# Patient Record
Sex: Male | Born: 1965 | Race: White | Hispanic: No | Marital: Married | Smoking: Never smoker
Health system: Southern US, Community
[De-identification: ages and names within clinical notes are randomized; demographics above are authoritative.]

---

## 1998-10-16 ENCOUNTER — Emergency Department (HOSPITAL_COMMUNITY): Admission: EM | Admit: 1998-10-16 | Discharge: 1998-10-17 | Payer: Self-pay

## 1998-10-19 ENCOUNTER — Emergency Department (HOSPITAL_COMMUNITY): Admission: EM | Admit: 1998-10-19 | Discharge: 1998-10-19 | Payer: Self-pay | Admitting: Emergency Medicine

## 1998-10-21 ENCOUNTER — Emergency Department (HOSPITAL_COMMUNITY): Admission: EM | Admit: 1998-10-21 | Discharge: 1998-10-21 | Payer: Self-pay

## 1999-12-23 ENCOUNTER — Encounter: Payer: Self-pay | Admitting: Emergency Medicine

## 1999-12-23 ENCOUNTER — Emergency Department (HOSPITAL_COMMUNITY): Admission: EM | Admit: 1999-12-23 | Discharge: 1999-12-23 | Payer: Self-pay | Admitting: *Deleted

## 2004-04-04 ENCOUNTER — Emergency Department (HOSPITAL_COMMUNITY): Admission: EM | Admit: 2004-04-04 | Discharge: 2004-04-04 | Payer: Self-pay | Admitting: Family Medicine

## 2006-03-10 ENCOUNTER — Emergency Department (HOSPITAL_COMMUNITY): Admission: EM | Admit: 2006-03-10 | Discharge: 2006-03-10 | Payer: Self-pay | Admitting: *Deleted

## 2006-10-13 ENCOUNTER — Emergency Department (HOSPITAL_COMMUNITY): Admission: EM | Admit: 2006-10-13 | Discharge: 2006-10-13 | Payer: Self-pay | Admitting: Emergency Medicine

## 2007-01-01 ENCOUNTER — Emergency Department (HOSPITAL_COMMUNITY): Admission: EM | Admit: 2007-01-01 | Discharge: 2007-01-01 | Payer: Self-pay | Admitting: Emergency Medicine

## 2017-05-08 DIAGNOSIS — E7800 Pure hypercholesterolemia, unspecified: Secondary | ICD-10-CM | POA: Diagnosis not present

## 2017-05-08 DIAGNOSIS — Z0000 Encounter for general adult medical examination without abnormal findings: Secondary | ICD-10-CM | POA: Diagnosis not present

## 2017-05-10 DIAGNOSIS — E7800 Pure hypercholesterolemia, unspecified: Secondary | ICD-10-CM | POA: Diagnosis not present

## 2017-06-20 DIAGNOSIS — M7542 Impingement syndrome of left shoulder: Secondary | ICD-10-CM | POA: Diagnosis not present

## 2017-06-20 DIAGNOSIS — M25512 Pain in left shoulder: Secondary | ICD-10-CM | POA: Diagnosis not present

## 2017-06-21 DIAGNOSIS — M25612 Stiffness of left shoulder, not elsewhere classified: Secondary | ICD-10-CM | POA: Diagnosis not present

## 2017-06-21 DIAGNOSIS — M19012 Primary osteoarthritis, left shoulder: Secondary | ICD-10-CM | POA: Diagnosis not present

## 2017-06-21 DIAGNOSIS — M25512 Pain in left shoulder: Secondary | ICD-10-CM | POA: Diagnosis not present

## 2018-10-10 DIAGNOSIS — Z23 Encounter for immunization: Secondary | ICD-10-CM | POA: Diagnosis not present

## 2019-09-03 DIAGNOSIS — Z20828 Contact with and (suspected) exposure to other viral communicable diseases: Secondary | ICD-10-CM | POA: Diagnosis not present

## 2019-09-09 ENCOUNTER — Other Ambulatory Visit: Payer: Self-pay

## 2019-09-09 DIAGNOSIS — Z20822 Contact with and (suspected) exposure to covid-19: Secondary | ICD-10-CM

## 2019-09-10 LAB — NOVEL CORONAVIRUS, NAA: SARS-CoV-2, NAA: NOT DETECTED

## 2019-10-13 ENCOUNTER — Other Ambulatory Visit: Payer: Self-pay | Admitting: *Deleted

## 2019-10-13 DIAGNOSIS — Z20822 Contact with and (suspected) exposure to covid-19: Secondary | ICD-10-CM

## 2019-10-13 DIAGNOSIS — Z20828 Contact with and (suspected) exposure to other viral communicable diseases: Secondary | ICD-10-CM | POA: Diagnosis not present

## 2019-10-15 LAB — NOVEL CORONAVIRUS, NAA: SARS-CoV-2, NAA: NOT DETECTED

## 2020-01-22 DIAGNOSIS — Z20822 Contact with and (suspected) exposure to covid-19: Secondary | ICD-10-CM | POA: Diagnosis not present

## 2020-02-29 DIAGNOSIS — M25561 Pain in right knee: Secondary | ICD-10-CM | POA: Diagnosis not present

## 2020-02-29 DIAGNOSIS — Z6828 Body mass index (BMI) 28.0-28.9, adult: Secondary | ICD-10-CM | POA: Diagnosis not present

## 2020-03-02 ENCOUNTER — Ambulatory Visit (INDEPENDENT_AMBULATORY_CARE_PROVIDER_SITE_OTHER): Payer: BC Managed Care – PPO | Admitting: Orthopaedic Surgery

## 2020-03-02 ENCOUNTER — Other Ambulatory Visit: Payer: Self-pay

## 2020-03-02 ENCOUNTER — Encounter: Payer: Self-pay | Admitting: Orthopaedic Surgery

## 2020-03-02 VITALS — BP 122/111 | HR 100 | Ht 72.0 in | Wt 210.5 lb

## 2020-03-02 DIAGNOSIS — M25561 Pain in right knee: Secondary | ICD-10-CM

## 2020-03-02 NOTE — Progress Notes (Signed)
Subjective:    Patient ID: Larry Ramirez, male    DOB: 1966/12/16, 54 y.o.   MRN: [id number]  HPI He was lifting his dog on 02-27-2020 and twisted his knee on the right.  He felt pain also in his back and his right knee.  His back is much better but his knee has gotten much worse.  He has swelling of the right knee and cannot bear weight on it.  He cannot fully extend the right knee.  He has had locking twice of the knee.  It gives way.  It really hurts.  He saw his family doctor at [facility] on 02-29-2020.  X-rays were done.  I have independently reviewed and interpreted x-rays of this patient done at another site by another physician or qualified health professional.  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  No fracture was noted  He is on ibuprofen and prednisone from his family doctor.  He cannot stand and cannot work.   He has no redness, no other trauma.   Review of Systems  Constitutional: Positive for activity change.  Musculoskeletal: Positive for arthralgias, gait problem and joint swelling.  All other systems reviewed and are negative.  For Review of Systems, all other systems reviewed and are negative.  The following is a summary of the past history medically, past history surgically, known current medicines, social history and family history.  This information is gathered electronically by the computer from prior information and documentation.  I review this each visit and have found including this information at this point in the chart is beneficial and informative.   History reviewed. No pertinent past medical history.  History reviewed. No pertinent surgical history.  Current Outpatient Medications on File Prior to Visit  Medication Sig Dispense Refill  . doxylamine, Sleep, (UNISOM) 25 MG tablet Take 25 mg by mouth at bedtime as needed.    Marland Kitchen ibuprofen (ADVIL) 800 MG tablet Take 800 mg by mouth every 8 (eight) hours as needed.     No current  facility-administered medications on file prior to visit.    Social History   Socioeconomic History  . Marital status: Married    Spouse name: Not on file  . Number of children: Not on file  . Years of education: Not on file  . Highest education level: Not on file  Occupational History  . Not on file  Tobacco Use  . Smoking status: Never Smoker  . Smokeless tobacco: Never Used  Substance and Sexual Activity  . Alcohol use: Not on file  . Drug use: Not on file  . Sexual activity: Not on file  Other Topics Concern  . Not on file  Social History Narrative  . Not on file   Social Determinants of Health   Financial Resource Strain:   . Difficulty of Paying Living Expenses: Not on file  Food Insecurity:   . Worried About Programme researcher, broadcasting/film/video in the Last Year: Not on file  . Ran Out of Food in the Last Year: Not on file  Transportation Needs:   . Lack of Transportation (Medical): Not on file  . Lack of Transportation (Non-Medical): Not on file  Physical Activity:   . Days of Exercise per Week: Not on file  . Minutes of Exercise per Session: Not on file  Stress:   . Feeling of Stress : Not on file  Social Connections:   . Frequency of Communication with Friends and Family:  Not on file  . Frequency of Social Gatherings with Friends and Family: Not on file  . Attends Religious Services: Not on file  . Active Member of Clubs or Organizations: Not on file  . Attends Archivist Meetings: Not on file  . Marital Status: Not on file  Intimate Partner Violence:   . Fear of Current or Ex-Partner: Not on file  . Emotionally Abused: Not on file  . Physically Abused: Not on file  . Sexually Abused: Not on file    History reviewed. No pertinent family history.  BP (!) 122/111   Pulse 100   Ht 6' (1.829 m)   Wt 210 lb 8 oz (95.5 kg)   BMI 28.55 kg/m   Body mass index is 28.55 kg/m.     Objective:   Physical Exam Vitals and nursing note reviewed.    Constitutional:      Appearance: He is well-developed.  HENT:     Head: Normocephalic and atraumatic.  Eyes:     Conjunctiva/sclera: Conjunctivae normal.     Pupils: Pupils are equal, round, and reactive to light.  Cardiovascular:     Rate and Rhythm: Normal rate and regular rhythm.  Pulmonary:     Effort: Pulmonary effort is normal.  Abdominal:     Palpations: Abdomen is soft.  Musculoskeletal:     Cervical back: Normal range of motion and neck supple.       Legs:  Skin:    General: Skin is warm and dry.  Neurological:     Mental Status: He is alert and oriented to person, place, and time.     Cranial Nerves: No cranial nerve deficit.     Motor: No abnormal muscle tone.     Coordination: Coordination normal.     Deep Tendon Reflexes: Reflexes are normal and symmetric. Reflexes normal.  Psychiatric:        Behavior: Behavior normal.        Thought Content: Thought content normal.        Judgment: Judgment normal.     I have reviewed the notes from [facility].     Assessment & Plan:   Encounter Diagnosis  Name Primary?  . Acute pain of right knee Yes   I feel he has a torn lateral meniscus.  I feel he may need surgery.  He needs MRI of the knee as he has locking, inability to fully extend, pain with any weight bearing, possible need for arthroscopy.  He is to continue his present medicine.  PROCEDURE NOTE:  The patient requests injections of the right knee , verbal consent was obtained.  The right knee was prepped appropriately after time out was performed.   Sterile technique was observed and injection of 1 cc of Depo-Medrol 40 mg with several cc's of plain xylocaine. Anesthesia was provided by ethyl chloride and a 20-gauge needle was used to inject the knee area. The injection was tolerated well.  A band aid dressing was applied.  The patient was advised to apply ice later today and tomorrow to the injection sight as needed.  I will try to schedule the MRI  asap.  Return after MRI.  Call if any problem.  Precautions discussed.  Out of work note given.  Disability form signed and completed for him.  Electronically Signed Sanjuana Kava, MD 3/3/202110:41 AM

## 2020-03-02 NOTE — Patient Instructions (Signed)
OUT OF WORK ?

## 2020-03-05 ENCOUNTER — Other Ambulatory Visit: Payer: Self-pay

## 2020-03-05 ENCOUNTER — Ambulatory Visit
Admission: RE | Admit: 2020-03-05 | Discharge: 2020-03-05 | Disposition: A | Discharge status: Home / Self Care | Payer: BC Managed Care – PPO | Source: Ambulatory Visit | Attending: Orthopaedic Surgery | Admitting: Orthopaedic Surgery

## 2020-03-05 DIAGNOSIS — M25561 Pain in right knee: Secondary | ICD-10-CM | POA: Diagnosis not present

## 2020-03-08 ENCOUNTER — Encounter: Payer: Self-pay | Admitting: Orthopaedic Surgery

## 2020-03-08 ENCOUNTER — Ambulatory Visit (INDEPENDENT_AMBULATORY_CARE_PROVIDER_SITE_OTHER): Payer: BC Managed Care – PPO | Admitting: Orthopaedic Surgery

## 2020-03-08 ENCOUNTER — Other Ambulatory Visit: Payer: Self-pay

## 2020-03-08 VITALS — BP 139/86 | HR 104 | Temp 98.0°F | Ht 72.0 in | Wt 208.0 lb

## 2020-03-08 DIAGNOSIS — M25561 Pain in right knee: Secondary | ICD-10-CM

## 2020-03-08 MED ORDER — NAPROXEN 500 MG PO TABS: 500.0000 mg | ORAL_TABLET | Freq: Two times a day (BID) | ORAL | 5 refills | Status: AC

## 2020-03-08 NOTE — Progress Notes (Signed)
Patient Larry Ramirez, male DOB:September 13, 1966, 54 y.o. IRC:[id number]  Chief Complaint  Patient presents with  . Knee Pain    R/here to review MRI    HPI  Larry Ramirez is a 54 y.o. male who has right knee pain.  He had MRI which showed: IMPRESSION: 1. Intact ligamentous structures and no acute bony findings. 2. No meniscal tears. 3. Mild/moderate tricompartmental degenerative chondrosis., most notable at the patellofemoral joint along the medial aspect of the apex. 4. No joint effusion or Baker's cyst.    I explained the findings to him.  I had thought he had a meniscus tear as he lacked full extension and it was locking.    He want to return to work.  I will give Naprosyn.  Stop the ibuprofen.  I will begin PT.  Body mass index is 28.21 kg/m.  ROS  Review of Systems  All other systems reviewed and are negative.  The following is a summary of the past history medically, past history surgically, known current medicines, social history and family history.  This information is gathered electronically by the computer from prior information and documentation.  I review this each visit and have found including this information at this point in the chart is beneficial and informative.    History reviewed. No pertinent past medical history.  History reviewed. No pertinent surgical history.  History reviewed. No pertinent family history.  Social History Social History   Tobacco Use  . Smoking status: Never Smoker  . Smokeless tobacco: Never Used  Substance Use Topics  . Alcohol use: Not on file  . Drug use: Not on file    No Known Allergies  Current Outpatient Medications  Medication Sig Dispense Refill  . doxylamine, Sleep, (UNISOM) 25 MG tablet Take 25 mg by mouth at bedtime as needed.    Marland Kitchen ibuprofen (ADVIL) 800 MG tablet Take 800 mg by mouth every 8 (eight) hours as needed.     No current facility-administered medications for this visit.     Physical  Exam  Blood pressure 139/86, pulse (!) 104, temperature 98 F (36.7 C), height 6' (1.829 m), weight 208 lb (94.3 kg).  Constitutional: overall normal hygiene, normal nutrition, well developed, normal grooming, normal body habitus. Assistive device:none  Musculoskeletal: gait and station Limp right, muscle tone and strength are normal, no tremors or atrophy is present.  .  Neurological: coordination overall normal.  Deep tendon reflex/nerve stretch intact.  Sensation normal.  Cranial nerves II-XII intact.   Skin:   Normal overall no scars, lesions, ulcers or rashes. No psoriasis.  Psychiatric: Alert and oriented x 3.  Recent memory intact, remote memory unclear.  Normal mood and affect. Well groomed.  Good eye contact.  Cardiovascular: overall no swelling, no varicosities, no edema bilaterally, normal temperatures of the legs and arms, no clubbing, cyanosis and good capillary refill.  Lymphatic: palpation is normal.  Right knee is tender, more laterally,  ROM 0 to 105, limp right, stable.  NV intact.  All other systems reviewed and are negative   The patient has been educated about the nature of the problem(s) and counseled on treatment options.  The patient appeared to understand what I have discussed and is in agreement with it.  Encounter Diagnosis  Name Primary?  . Acute pain of right knee Yes    PLAN Call if any problems.  Precautions discussed.  Continue current medications. Stop ibuprofen, begin Naprosyn.   Return to clinic 3 weeks  Begin PT.  Electronically Signed Sanjuana Kava, MD 3/9/202110:50 AM

## 2020-03-08 NOTE — Patient Instructions (Signed)
May return to work  

## 2020-03-14 DIAGNOSIS — M25561 Pain in right knee: Secondary | ICD-10-CM | POA: Diagnosis not present

## 2020-03-16 ENCOUNTER — Ambulatory Visit: Payer: BC Managed Care – PPO | Admitting: Orthopaedic Surgery

## 2020-03-29 ENCOUNTER — Ambulatory Visit (INDEPENDENT_AMBULATORY_CARE_PROVIDER_SITE_OTHER): Payer: BC Managed Care – PPO | Admitting: Orthopaedic Surgery

## 2020-03-29 ENCOUNTER — Other Ambulatory Visit: Payer: Self-pay

## 2020-03-29 ENCOUNTER — Encounter: Payer: Self-pay | Admitting: Orthopaedic Surgery

## 2020-03-29 VITALS — BP 114/78 | HR 83 | Ht 72.0 in | Wt 211.0 lb

## 2020-03-29 DIAGNOSIS — M25561 Pain in right knee: Secondary | ICD-10-CM | POA: Diagnosis not present

## 2020-03-29 NOTE — Progress Notes (Signed)
I am better  He went to PT once.  He is doing his exercises.  His knee is much improved, no pain, no swelling.  He works two jobs daily.  He has no new trauma. ROM is full.  Encounter Diagnosis  Name Primary?  . Acute pain of right knee Yes   I will see as needed.  Call if any problem.  Precautions discussed.   Electronically Signed Darreld Mclean, MD 3/30/20219:14 AM

## 2021-11-24 IMAGING — MR MR KNEE*R* W/O CM
6 series · 38 of 40 positions shown · non-contrast
Comparison: Radiograph 02/29/2020

CLINICAL DATA: Right knee pain, swelling and weakness for 1 week
after picking up dog.

EXAM:
MRI OF THE RIGHT KNEE WITHOUT CONTRAST
TECHNIQUE: Multiplanar, multisequence MR imaging of the knee was performed. No
intravenous contrast was administered.

[Series 6: T2 fat-sat · axial · right · 4.0mm · 0.50mm/px · z∈[-112,+41]mm · 8 of 36 slices shown (1 of 3)]
[im 1/36]
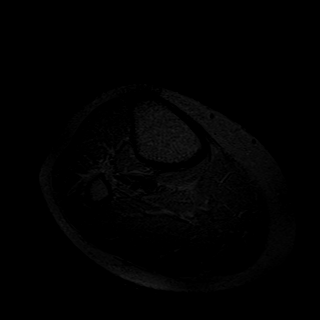
[im 6/36]
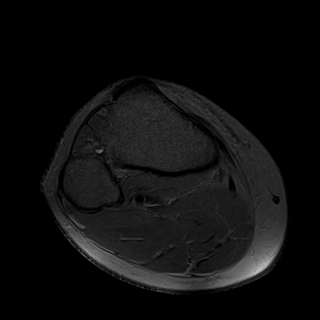
[im 11/36]
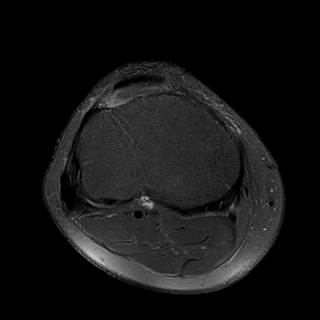
[im 16/36]
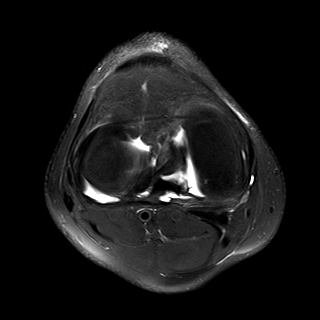
[im 21/36]
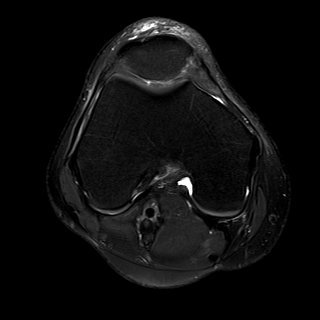
[im 26/36]
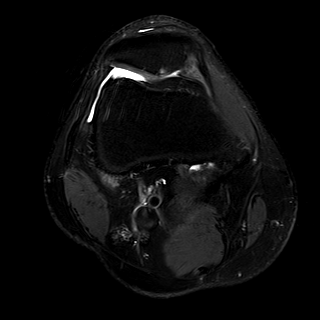
[im 31/36]
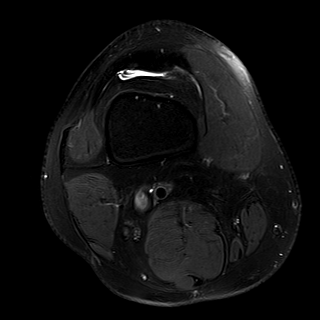
[im 36/36]
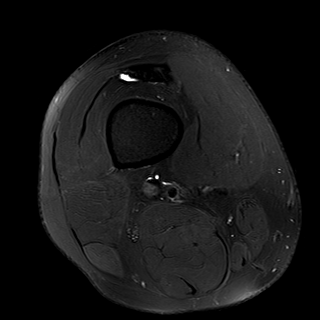

[Series 7: T2 fat-sat · coronal · right · 4.0mm · 0.42mm/px · 6 of 30 slices shown (2 of 3)]
[im 1/30]
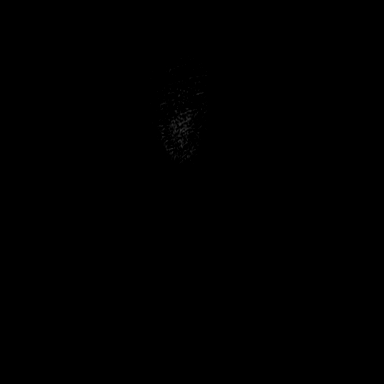
[im 6/30]
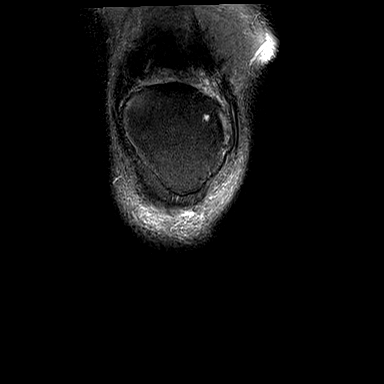
[im 12/30]
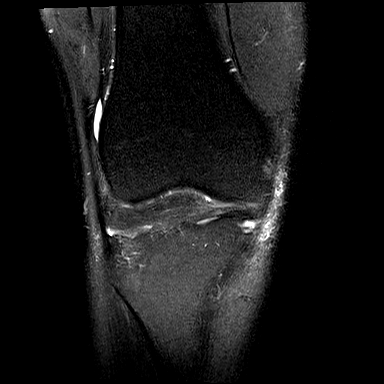
[im 18/30]
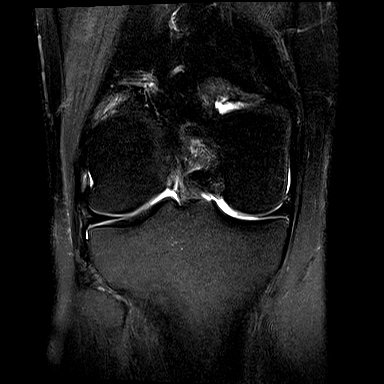
[im 24/30]
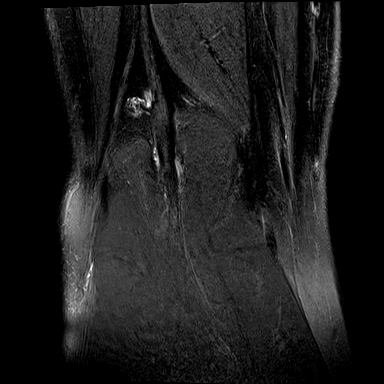
[im 30/30]
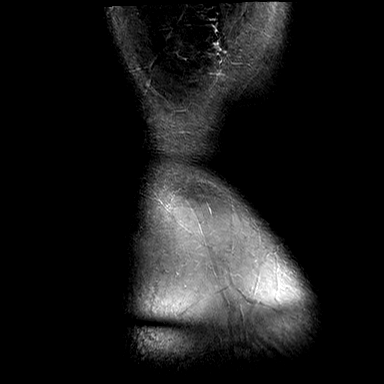

[Series 8: T1 · coronal · right · 4.0mm · 0.42mm/px · 4 of 30 slices shown]
[im 1/30]
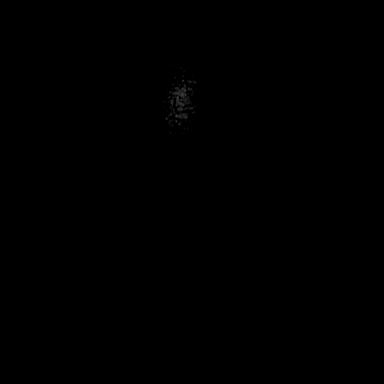
[im 6/30]
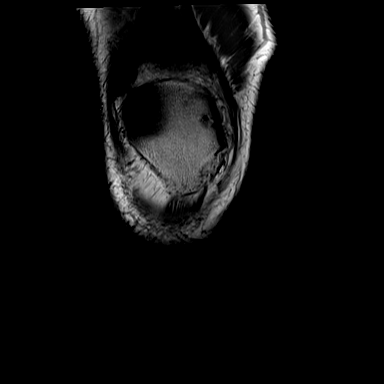
[im 12/30]
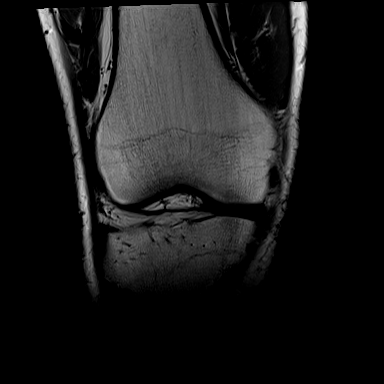
[im 18/30]
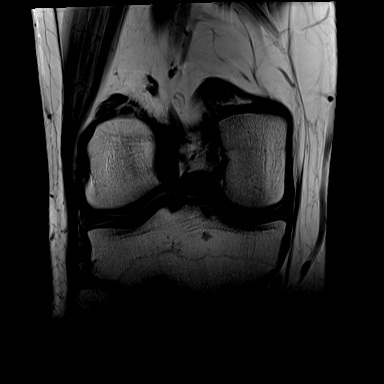

[Series 9: PD fat-sat · coronal · right · 3.0mm · 0.47mm/px · 8 of 36 slices shown (1 of 2)]
[im 1/36]
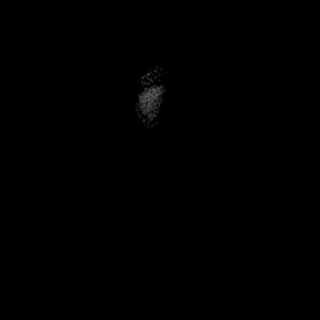
[im 6/36]
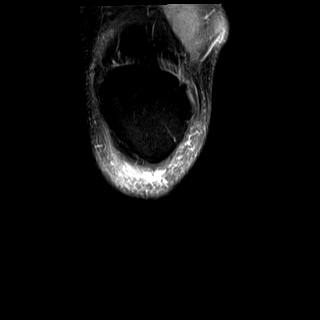
[im 11/36]
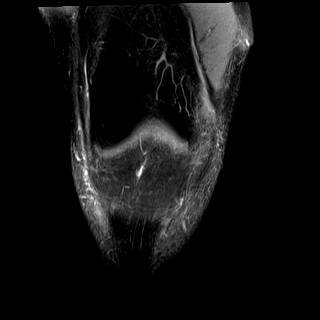
[im 16/36]
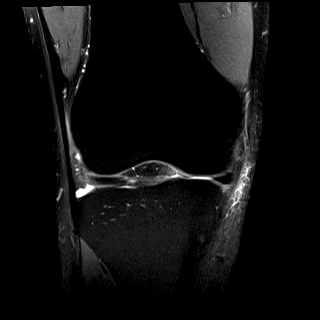
[im 21/36]
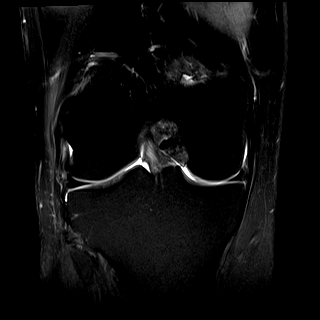
[im 26/36]
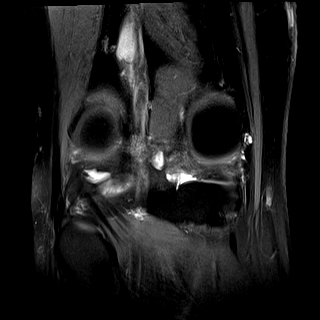
[im 31/36]
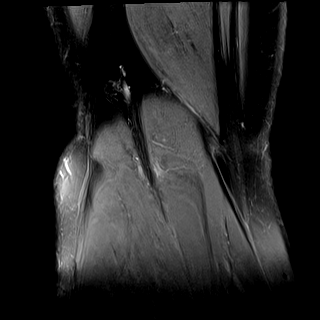
[im 36/36]
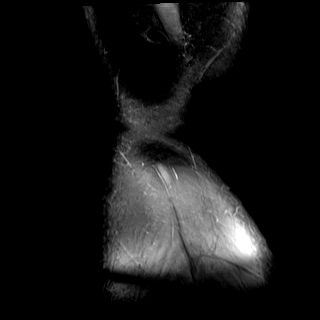

[Series 10: PD fat-sat · sagittal · right · 3.0mm · 0.42mm/px · 6 of 29 slices shown (2 of 2)]
[im 1/29]
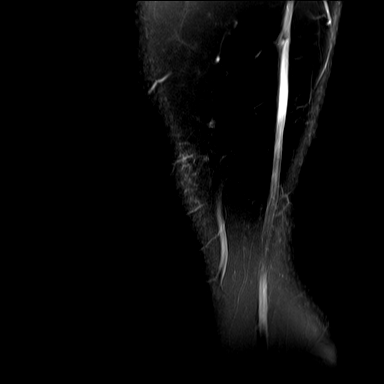
[im 6/29]
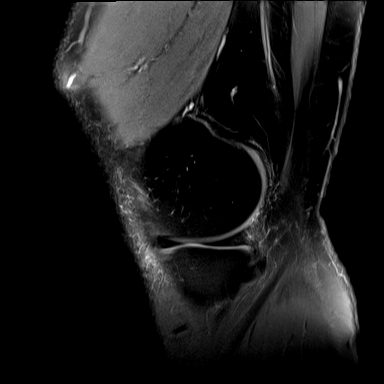
[im 12/29]
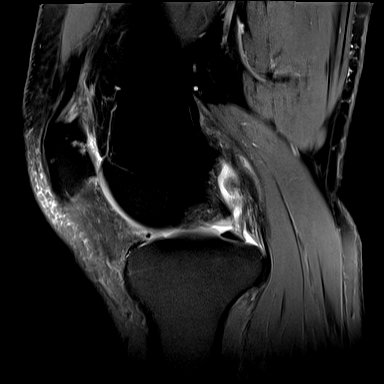
[im 17/29]
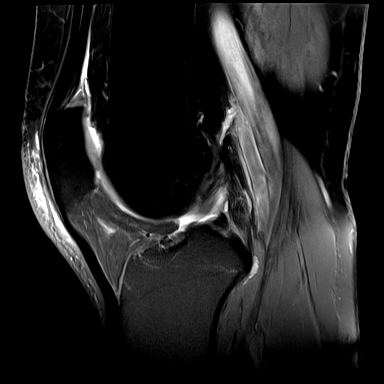
[im 23/29]
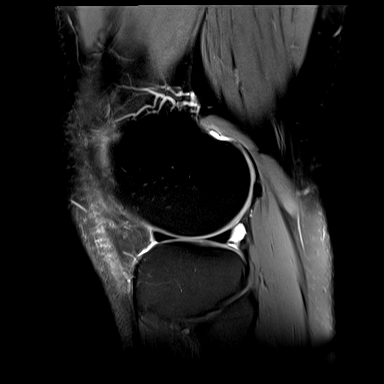
[im 29/29]
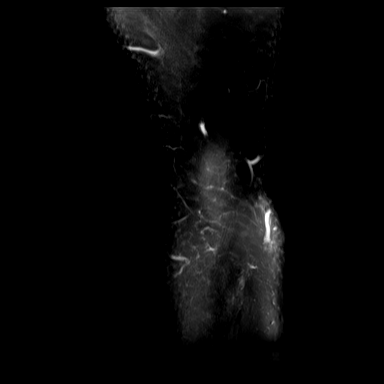

[Series 11: T2 fat-sat · sagittal · right · 3.0mm · 0.42mm/px · 6 of 30 slices shown (3 of 3)]
[im 1/30]
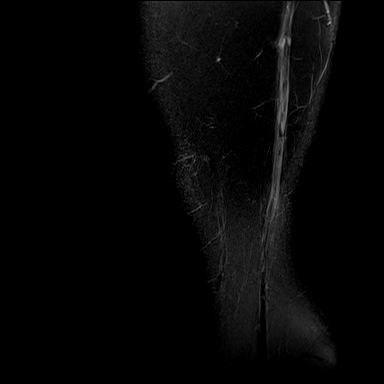
[im 6/30]
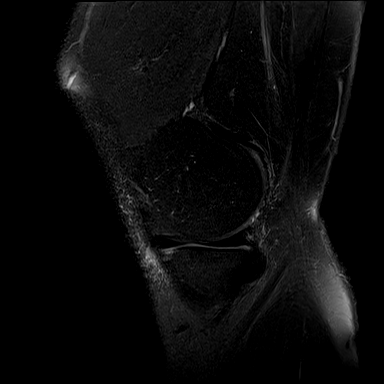
[im 12/30]
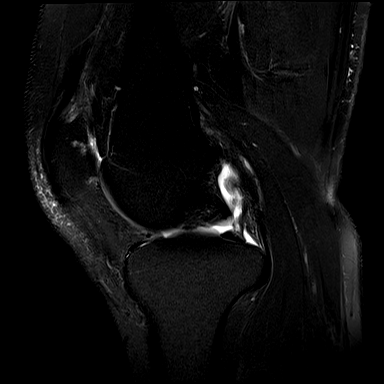
[im 18/30]
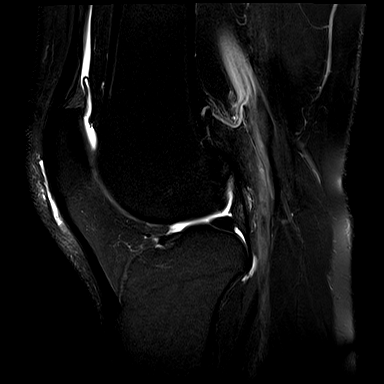
[im 24/30]
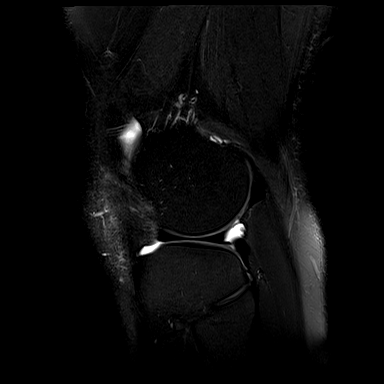
[im 30/30]
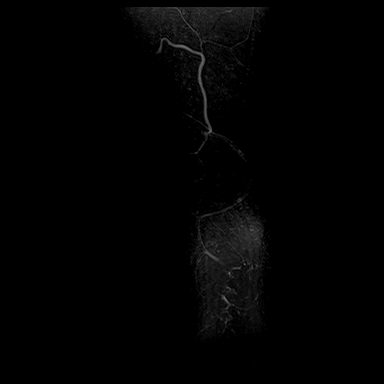

[38 of 40 positions shown; findings below may reference images not displayed]

FINDINGS: MENISCI

Medial meniscus:  Intact

Lateral meniscus:  Intact

LIGAMENTS

Cruciates:  Intact

Collaterals:  Intact

CARTILAGE

Patellofemoral: Mild/moderate degenerative chondrosis mainly along
the patellar apex medially. Focal area of cartilage irregularity and
subchondral cystic change.

Medial: Mild degenerative chondrosis with slight joint space
narrowing.

Lateral: Mild degenerative chondrosis mainly involving the tibial
articular cartilage.

Joint:  No joint effusion.

Popliteal Fossa:  No popliteal mass or Baker's cyst.

Extensor Mechanism: The patella retinacular structures are intact
and the quadriceps and patellar tendons are intact. Mild proximal
patellar tendinopathy the.

Bones:  No acute bony findings.

Other: Normal knee musculature.
IMPRESSION: 1. Intact ligamentous structures and no acute bony findings.
2. No meniscal tears.
3. Mild/moderate tricompartmental degenerative chondrosis., most
notable at the patellofemoral joint along the medial aspect of the
apex.
4. No joint effusion or Baker's cyst.

## 2023-02-28 ENCOUNTER — Encounter: Payer: Self-pay | Admitting: Radiology
